# Patient Record
Sex: Female | Born: 1950 | Race: White | Hispanic: No | Marital: Single
Health system: Southern US, Community
[De-identification: ages and names within clinical notes are randomized; demographics above are authoritative.]

---

## 2017-05-06 ENCOUNTER — Ambulatory Visit: Payer: Self-pay

## 2017-06-17 ENCOUNTER — Other Ambulatory Visit: Payer: Self-pay | Admitting: *Deleted

## 2017-06-17 ENCOUNTER — Encounter: Payer: Self-pay | Admitting: *Deleted

## 2017-06-17 ENCOUNTER — Encounter (INDEPENDENT_AMBULATORY_CARE_PROVIDER_SITE_OTHER): Payer: Self-pay

## 2017-06-17 ENCOUNTER — Ambulatory Visit
Admission: RE | Admit: 2017-06-17 | Discharge: 2017-06-17 | Disposition: A | Discharge status: Home / Self Care | Payer: Self-pay | Source: Ambulatory Visit | Attending: Oncology | Admitting: Oncology

## 2017-06-17 ENCOUNTER — Ambulatory Visit: Payer: Self-pay | Attending: Oncology | Admitting: *Deleted

## 2017-06-17 VITALS — BP 140/80 | HR 64 | Temp 95.6°F | Resp 20 | Ht 72.0 in | Wt 312.0 lb

## 2017-06-17 DIAGNOSIS — Z0000 Encounter for general adult medical examination without abnormal findings: Secondary | ICD-10-CM

## 2017-06-17 NOTE — Patient Instructions (Signed)
HPV Test The human papillomavirus (HPV) test is used to look for high-risk types of HPV infection. HPV is a group of about 100 viruses. Many of these viruses cause growths on, in, or around the genitals. Most HPV viruses cause infections that usually go away without treatment. However, HPV types 6, 11, 16, and 18 are considered high-risk types of HPV that can increase your risk of cancer of the cervix or anus if the infection is left untreated. An HPV test identifies the DNA (genetic) strands of the HPV infection, so it is also referred to as the HPV DNA test. Although HPV is found in both males and females, the HPV test is only used to screen for increased cancer risk in females:  With an abnormal Pap test.  After treatment of an abnormal Pap test.  Between the ages of 30 and 65.  After treatment of a high-risk HPV infection.  The HPV test may be done at the same time as a pelvic exam and Pap test in females over the age of 30. Both the HPV test and Pap test require a sample of cells from the cervix. How do I prepare for this test?  Do not douche or take a bath for 24-48 hours before the test or as directed by your health care provider.  Do not have sex for 24-48 hours before the test or as directed by your health care provider.  You may be asked to reschedule the test if you are menstruating.  You will be asked to urinate before the test. What do the results mean? It is your responsibility to obtain your test results. Ask the lab or department performing the test when and how you will get your results. Talk with your health care provider if you have any questions about your results. Your result will be negative or positive. Meaning of Negative Test Results A negative HPV test result means that no HPV was found, and it is very likely that you do not have HPV. Meaning of Positive Test Results A positive HPV test result indicates that you have HPV.  If your test result shows the presence  of any high-risk HPV strains, you may have an increased risk of developing cancer of the cervix or anus if the infection is left untreated.  If any low-risk HPV strains are found, you are not likely to have an increased risk of cancer.  Discuss your test results with your health care provider. He or she will use the results to make a diagnosis and determine a treatment plan that is right for you. Talk with your health care provider to discuss your results, treatment options, and if necessary, the need for more tests. Talk with your health care provider if you have any questions about your results. This information is not intended to replace advice given to you by your health care provider. Make sure you discuss any questions you have with your health care provider. Document Released: 07/11/2004 Document Revised: 02/20/2016 Document Reviewed: 11/01/2013 Elsevier Interactive Patient Education  2018 [employer]   Gave patient hand-out, Women Staying Healthy, Active and Well from [facility], with education on breast health, pap smears, heart and colon health.  

## 2017-06-17 NOTE — Progress Notes (Signed)
Subjective:     Patient ID: Mckenzie Evans, female   DOB: 11/04/1950, 66 y.o.   MRN: [id number]  HPI   Review of Systems     Objective:   Physical Exam  Pulmonary/Chest: Right breast exhibits no inverted nipple, no mass, no nipple discharge, no skin change and no tenderness. Left breast exhibits no inverted nipple, no mass, no nipple discharge, no skin change and no tenderness. Breasts are symmetrical.  Abdominal: There is no splenomegaly or hepatomegaly.  Genitourinary: No labial fusion. There is no rash, tenderness, lesion or injury on the right labia. There is no rash, tenderness, lesion or injury on the left labia. Cervix exhibits no motion tenderness, no discharge and no friability. Right adnexum displays no mass, no tenderness and no fullness. Left adnexum displays no mass, no tenderness and no fullness. No erythema, tenderness or bleeding in the vagina. No foreign body in the vagina. No signs of injury around the vagina. No vaginal discharge found.       Assessment:     66 year old White [facility], pap and mammogram.  [place] mamm[place] in [place] per patient.  There are no records for review.  Clinical breast exam unremarkable.  Taught self breast awareness.  Since there is no pap for review, I will go ahead and collect a specimen for pap smear.  Specimen collected for pap without difficulty.  Patient has been screened for eligibility.  She does not have any insurance, Medicare or Medicaid.  She also meets financial eligibility.  Hand-out given on the Affordable Care Act.    Plan:     Screening mammogram ordered.  Specimen for pap sent to the lab.  Will follow-up per [facility] protocol.

## 2017-06-20 LAB — PAP LB AND HPV HIGH-RISK
HPV, HIGH-RISK: NEGATIVE
PAP Smear Comment: 0

## 2017-06-29 ENCOUNTER — Encounter: Payer: Self-pay | Admitting: *Deleted

## 2017-06-29 NOTE — Progress Notes (Signed)
Letter mailed to inform patient of her normal mammogram and pap results.  To follow-up in one year with annual mammogram and in 5 years for her next pap smear.  [facility]

## 2018-06-03 ENCOUNTER — Other Ambulatory Visit: Payer: Self-pay | Admitting: Family Medicine

## 2018-06-03 DIAGNOSIS — Z1231 Encounter for screening mammogram for malignant neoplasm of breast: Secondary | ICD-10-CM

## 2018-07-13 ENCOUNTER — Ambulatory Visit
Admission: RE | Admit: 2018-07-13 | Discharge: 2018-07-13 | Disposition: A | Discharge status: Home / Self Care | Payer: Medicare Other | Source: Ambulatory Visit | Attending: Family Medicine | Admitting: Family Medicine

## 2018-07-13 DIAGNOSIS — Z1231 Encounter for screening mammogram for malignant neoplasm of breast: Secondary | ICD-10-CM | POA: Diagnosis present

## 2019-10-27 ENCOUNTER — Other Ambulatory Visit: Payer: Self-pay | Admitting: Family Medicine

## 2019-10-27 DIAGNOSIS — Z1231 Encounter for screening mammogram for malignant neoplasm of breast: Secondary | ICD-10-CM

## 2019-10-31 ENCOUNTER — Ambulatory Visit
Admission: RE | Admit: 2019-10-31 | Discharge: 2019-10-31 | Disposition: A | Discharge status: Home / Self Care | Payer: Medicare Other | Source: Ambulatory Visit | Attending: Family Medicine | Admitting: Family Medicine

## 2019-10-31 DIAGNOSIS — Z1231 Encounter for screening mammogram for malignant neoplasm of breast: Secondary | ICD-10-CM | POA: Insufficient documentation

## 2020-10-02 ENCOUNTER — Other Ambulatory Visit: Payer: Self-pay | Admitting: Family Medicine

## 2021-03-16 IMAGING — MG DIGITAL SCREENING BILAT W/ TOMO W/ CAD
8 of 14 series · 8 of 40 positions shown · non-contrast
Comparison: Previous exam(s).

ACR Breast Density Category a: The breast tissue is almost entirely
fatty.

CLINICAL DATA: Screening.

EXAM:
DIGITAL SCREENING BILATERAL MAMMOGRAM WITH TOMO AND CAD

[L MLO synth-2D (1 of 2)]
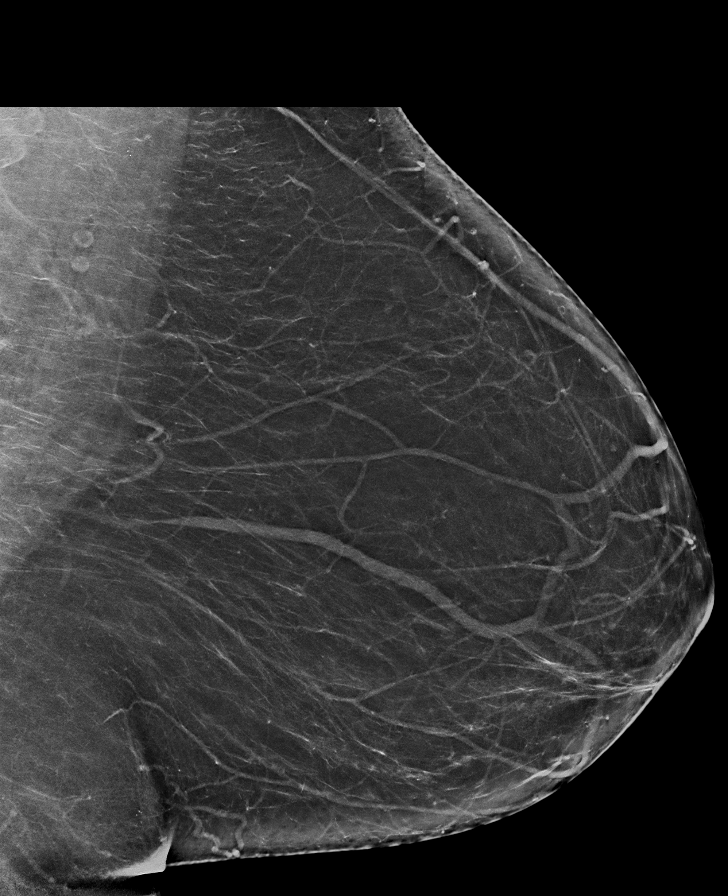

[L CV synth-2D]
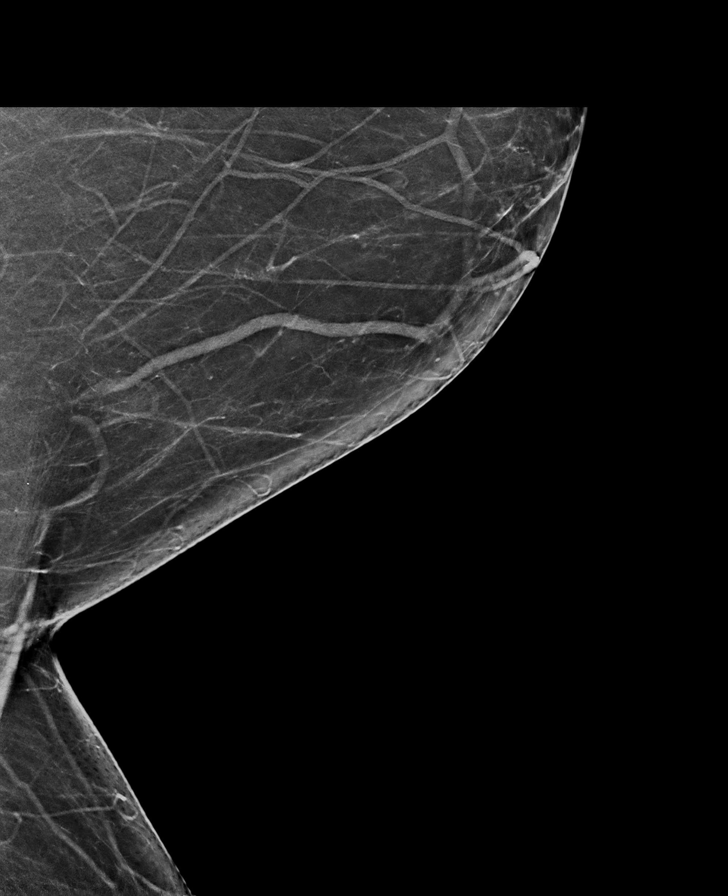

[L MLO synth-2D (2 of 2)]
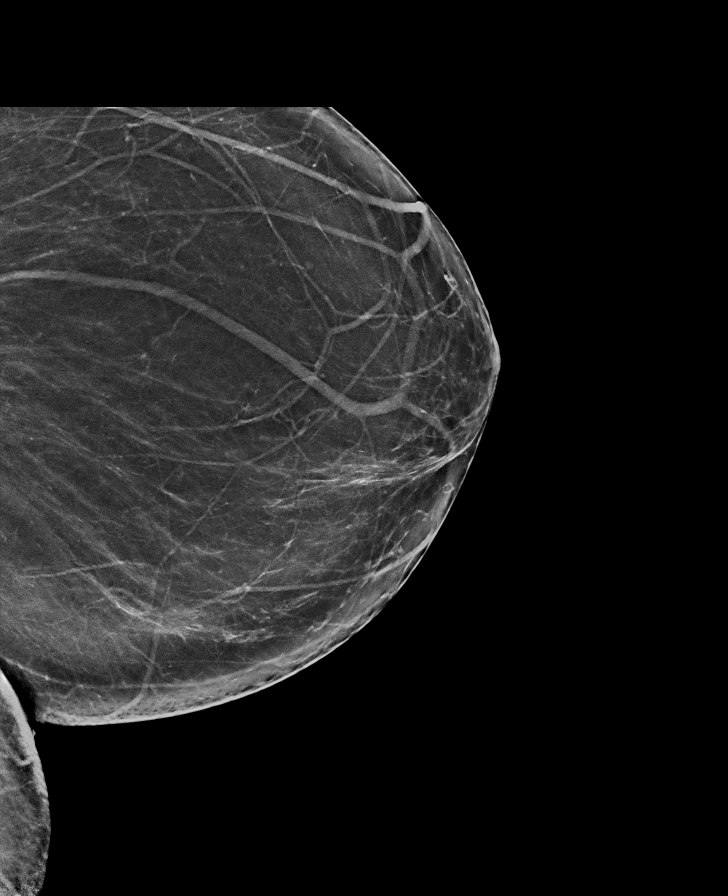

[R CC synth-2D]
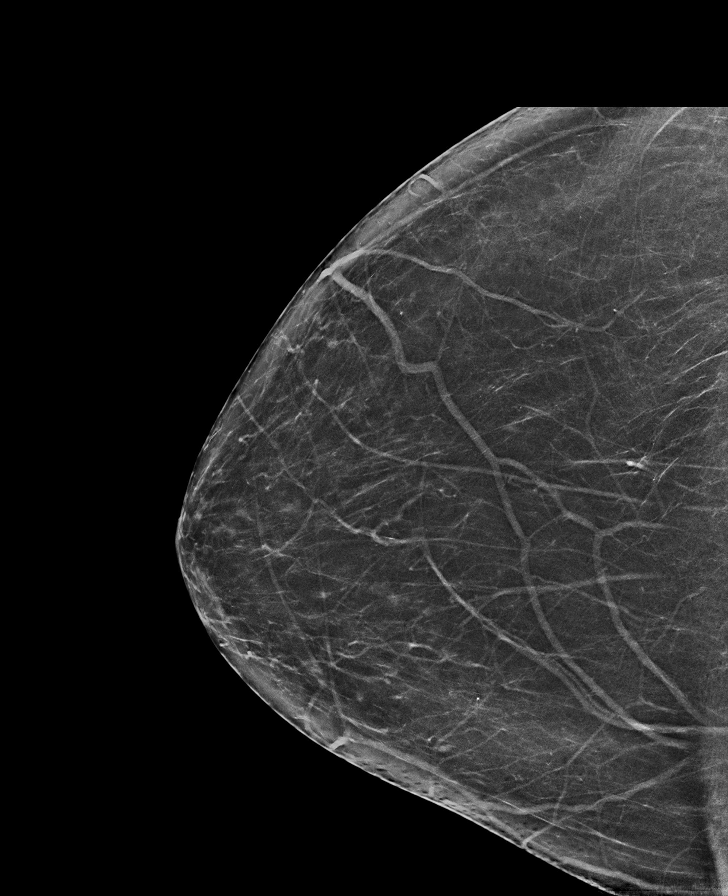

[R MLO synth-2D]
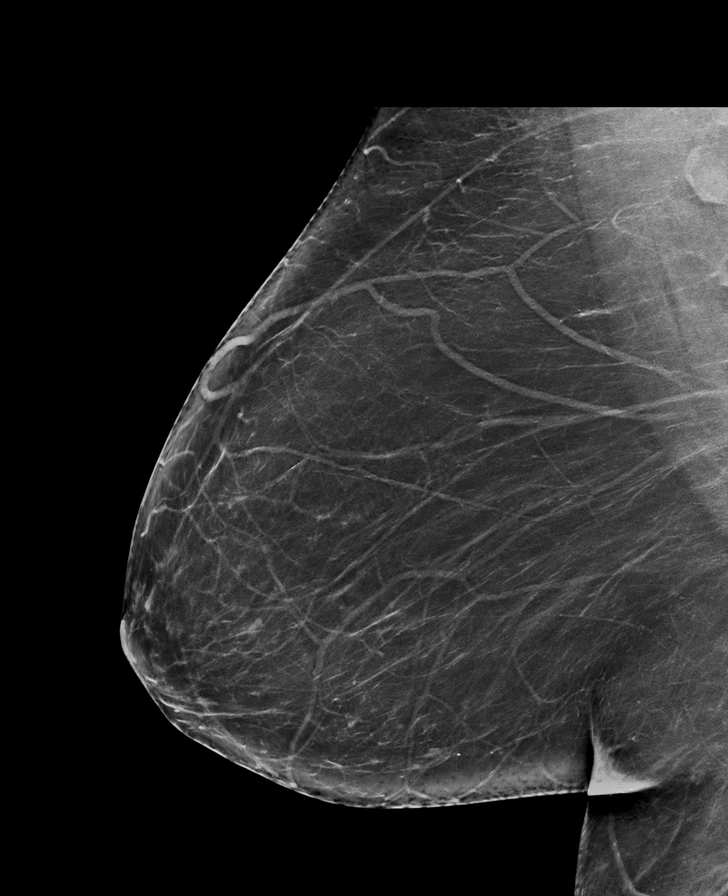

[R CV synth-2D]
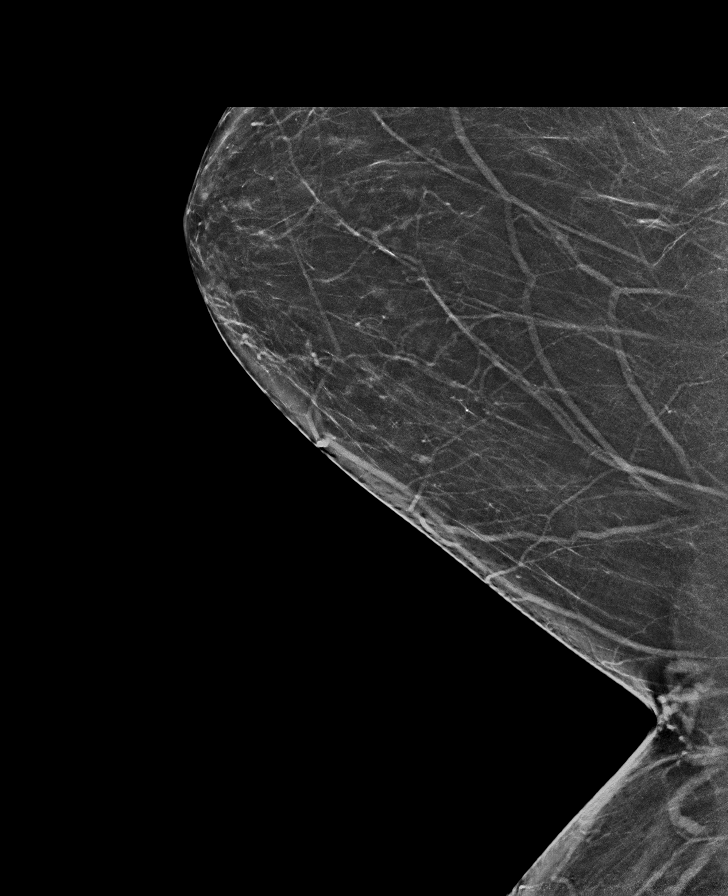

[L CC synth-2D]
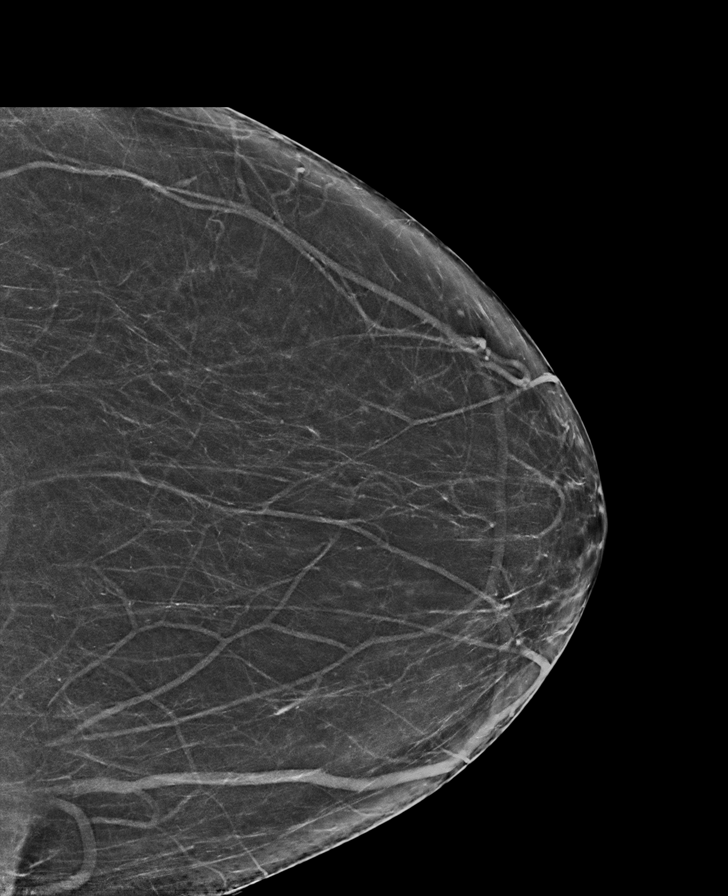

[L MLO tomo · tomo slice 43/84.0]
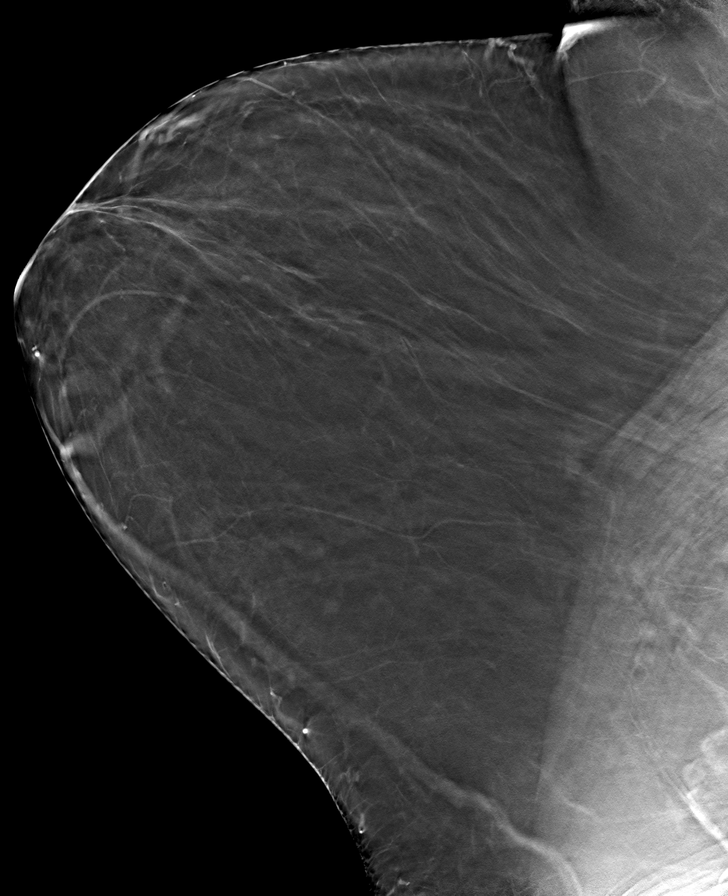

[8 of 40 positions shown; findings below may reference images not displayed]

FINDINGS: There are no findings suspicious for malignancy. Images were
processed with CAD.
IMPRESSION: No mammographic evidence of malignancy. A result letter of this
screening mammogram will be mailed directly to the patient.

RECOMMENDATION:
Screening mammogram in one year. (Code:[id number]-[id number])

BI-RADS CATEGORY  1: Negative.

## 2022-01-27 ENCOUNTER — Other Ambulatory Visit: Payer: Self-pay | Admitting: Student

## 2022-01-27 DIAGNOSIS — Z1231 Encounter for screening mammogram for malignant neoplasm of breast: Secondary | ICD-10-CM

## 2022-02-12 ENCOUNTER — Ambulatory Visit
Admission: RE | Admit: 2022-02-12 | Discharge: 2022-02-12 | Disposition: A | Discharge status: Home / Self Care | Payer: Medicare HMO | Source: Ambulatory Visit | Attending: Student | Admitting: Student

## 2022-02-12 DIAGNOSIS — Z1231 Encounter for screening mammogram for malignant neoplasm of breast: Secondary | ICD-10-CM | POA: Diagnosis present

## 2023-05-11 ENCOUNTER — Other Ambulatory Visit: Payer: Self-pay | Admitting: Family Medicine

## 2023-05-11 DIAGNOSIS — Z1231 Encounter for screening mammogram for malignant neoplasm of breast: Secondary | ICD-10-CM

## 2023-06-03 ENCOUNTER — Ambulatory Visit
Admission: RE | Admit: 2023-06-03 | Discharge: 2023-06-03 | Disposition: A | Discharge status: Home / Self Care | Payer: Medicare HMO | Source: Ambulatory Visit | Attending: Family Medicine | Admitting: Family Medicine

## 2023-06-03 DIAGNOSIS — Z1231 Encounter for screening mammogram for malignant neoplasm of breast: Secondary | ICD-10-CM | POA: Insufficient documentation

## 2024-06-03 ENCOUNTER — Other Ambulatory Visit: Payer: Self-pay | Admitting: Family Medicine

## 2024-06-03 DIAGNOSIS — Z1231 Encounter for screening mammogram for malignant neoplasm of breast: Secondary | ICD-10-CM

## 2024-07-12 ENCOUNTER — Ambulatory Visit
Admission: RE | Admit: 2024-07-12 | Discharge: 2024-07-12 | Disposition: A | Discharge status: Home / Self Care | Source: Ambulatory Visit | Attending: Family Medicine | Admitting: Family Medicine

## 2024-07-12 DIAGNOSIS — Z1231 Encounter for screening mammogram for malignant neoplasm of breast: Secondary | ICD-10-CM | POA: Diagnosis present

## 2024-07-15 ENCOUNTER — Other Ambulatory Visit: Payer: Self-pay | Admitting: Student

## 2024-07-15 DIAGNOSIS — R928 Other abnormal and inconclusive findings on diagnostic imaging of breast: Secondary | ICD-10-CM

## 2024-07-20 ENCOUNTER — Ambulatory Visit
Admission: RE | Admit: 2024-07-20 | Discharge: 2024-07-20 | Disposition: A | Discharge status: Home / Self Care | Source: Ambulatory Visit | Attending: Student | Admitting: Student

## 2024-07-20 DIAGNOSIS — R928 Other abnormal and inconclusive findings on diagnostic imaging of breast: Secondary | ICD-10-CM | POA: Insufficient documentation

## 2024-07-26 ENCOUNTER — Other Ambulatory Visit: Payer: Self-pay | Admitting: Student

## 2024-07-26 DIAGNOSIS — R928 Other abnormal and inconclusive findings on diagnostic imaging of breast: Secondary | ICD-10-CM

## 2024-07-26 DIAGNOSIS — R921 Mammographic calcification found on diagnostic imaging of breast: Secondary | ICD-10-CM

## 2024-08-04 ENCOUNTER — Encounter

## 2024-08-16 ENCOUNTER — Encounter
